# Patient Record
Sex: Female | Born: 1935 | Race: White | Hispanic: No
Health system: Southern US, Community
[De-identification: ages and names within clinical notes are randomized; demographics above are authoritative.]

## PROBLEM LIST (undated history)

## (undated) DIAGNOSIS — R1310 Dysphagia, unspecified: Secondary | ICD-10-CM

## (undated) DIAGNOSIS — K219 Gastro-esophageal reflux disease without esophagitis: Secondary | ICD-10-CM

## (undated) DIAGNOSIS — F0390 Unspecified dementia without behavioral disturbance: Secondary | ICD-10-CM

## (undated) DIAGNOSIS — I10 Essential (primary) hypertension: Secondary | ICD-10-CM

## (undated) DIAGNOSIS — E119 Type 2 diabetes mellitus without complications: Secondary | ICD-10-CM

## (undated) DIAGNOSIS — F32A Depression, unspecified: Secondary | ICD-10-CM

## (undated) DIAGNOSIS — F329 Major depressive disorder, single episode, unspecified: Secondary | ICD-10-CM

## (undated) DIAGNOSIS — E079 Disorder of thyroid, unspecified: Secondary | ICD-10-CM

## (undated) DIAGNOSIS — F419 Anxiety disorder, unspecified: Secondary | ICD-10-CM

## (undated) DIAGNOSIS — M1990 Unspecified osteoarthritis, unspecified site: Secondary | ICD-10-CM

## (undated) DIAGNOSIS — H409 Unspecified glaucoma: Secondary | ICD-10-CM

## (undated) DIAGNOSIS — Z66 Do not resuscitate: Secondary | ICD-10-CM

## (undated) DIAGNOSIS — G8929 Other chronic pain: Secondary | ICD-10-CM

## (undated) DIAGNOSIS — E876 Hypokalemia: Secondary | ICD-10-CM

---

## 2005-03-23 ENCOUNTER — Ambulatory Visit: Payer: Self-pay | Admitting: Occupational Therapy

## 2016-05-15 ENCOUNTER — Emergency Department (HOSPITAL_COMMUNITY)
Admission: EM | Admit: 2016-05-15 | Discharge: 2016-05-15 | Disposition: A | Discharge status: Skilled Nursing Facility | Payer: Medicare Other | Attending: Emergency Medicine | Admitting: Emergency Medicine

## 2016-05-15 ENCOUNTER — Emergency Department (HOSPITAL_COMMUNITY): Payer: Medicare Other

## 2016-05-15 ENCOUNTER — Encounter (HOSPITAL_COMMUNITY): Payer: Self-pay

## 2016-05-15 DIAGNOSIS — E119 Type 2 diabetes mellitus without complications: Secondary | ICD-10-CM | POA: Insufficient documentation

## 2016-05-15 DIAGNOSIS — I10 Essential (primary) hypertension: Secondary | ICD-10-CM | POA: Diagnosis not present

## 2016-05-15 DIAGNOSIS — W19XXXA Unspecified fall, initial encounter: Secondary | ICD-10-CM

## 2016-05-15 DIAGNOSIS — M25552 Pain in left hip: Secondary | ICD-10-CM | POA: Insufficient documentation

## 2016-05-15 DIAGNOSIS — M1990 Unspecified osteoarthritis, unspecified site: Secondary | ICD-10-CM | POA: Insufficient documentation

## 2016-05-15 DIAGNOSIS — Z79899 Other long term (current) drug therapy: Secondary | ICD-10-CM | POA: Diagnosis not present

## 2016-05-15 DIAGNOSIS — F329 Major depressive disorder, single episode, unspecified: Secondary | ICD-10-CM | POA: Insufficient documentation

## 2016-05-15 DIAGNOSIS — F0390 Unspecified dementia without behavioral disturbance: Secondary | ICD-10-CM | POA: Insufficient documentation

## 2016-05-15 HISTORY — DX: Unspecified glaucoma: H40.9

## 2016-05-15 HISTORY — DX: Type 2 diabetes mellitus without complications: E11.9

## 2016-05-15 HISTORY — DX: Dysphagia, unspecified: R13.10

## 2016-05-15 HISTORY — DX: Unspecified osteoarthritis, unspecified site: M19.90

## 2016-05-15 HISTORY — DX: Anxiety disorder, unspecified: F41.9

## 2016-05-15 HISTORY — DX: Hypokalemia: E87.6

## 2016-05-15 HISTORY — DX: Depression, unspecified: F32.A

## 2016-05-15 HISTORY — DX: Gastro-esophageal reflux disease without esophagitis: K21.9

## 2016-05-15 HISTORY — DX: Major depressive disorder, single episode, unspecified: F32.9

## 2016-05-15 HISTORY — DX: Other chronic pain: G89.29

## 2016-05-15 HISTORY — DX: Disorder of thyroid, unspecified: E07.9

## 2016-05-15 HISTORY — DX: Essential (primary) hypertension: I10

## 2016-05-15 HISTORY — DX: Do not resuscitate: Z66

## 2016-05-15 HISTORY — DX: Unspecified dementia, unspecified severity, without behavioral disturbance, psychotic disturbance, mood disturbance, and anxiety: F03.90

## 2016-05-15 LAB — COMPREHENSIVE METABOLIC PANEL
ALT: 25 U/L (ref 14–54)
ANION GAP: 5 (ref 5–15)
AST: 22 U/L (ref 15–41)
Albumin: 3.8 g/dL (ref 3.5–5.0)
Alkaline Phosphatase: 73 U/L (ref 38–126)
BUN: 32 mg/dL — AB (ref 6–20)
CHLORIDE: 103 mmol/L (ref 101–111)
CO2: 27 mmol/L (ref 22–32)
Calcium: 8.9 mg/dL (ref 8.9–10.3)
Creatinine, Ser: 1.45 mg/dL — ABNORMAL HIGH (ref 0.44–1.00)
GFR, EST AFRICAN AMERICAN: 35 mL/min — AB (ref >60)
GFR, EST NON AFRICAN AMERICAN: 30 mL/min — AB (ref >60)
Glucose, Bld: 100 mg/dL — ABNORMAL HIGH (ref 65–99)
POTASSIUM: 5 mmol/L (ref 3.5–5.1)
Sodium: 135 mmol/L (ref 135–145)
Total Bilirubin: 0.5 mg/dL (ref 0.3–1.2)
Total Protein: 7.4 g/dL (ref 6.5–8.1)

## 2016-05-15 LAB — CBC WITH DIFFERENTIAL/PLATELET
BASOS ABS: 0 10*3/uL (ref 0.0–0.1)
BASOS PCT: 0 %
EOS ABS: 0.2 10*3/uL (ref 0.0–0.7)
EOS PCT: 3 %
HCT: 39.3 % (ref 36.0–46.0)
Hemoglobin: 12.4 g/dL (ref 12.0–15.0)
Lymphocytes Relative: 21 %
Lymphs Abs: 1.6 10*3/uL (ref 0.7–4.0)
MCH: 30.1 pg (ref 26.0–34.0)
MCHC: 31.6 g/dL (ref 30.0–36.0)
MCV: 95.4 fL (ref 78.0–100.0)
Monocytes Absolute: 0.5 10*3/uL (ref 0.1–1.0)
Monocytes Relative: 7 %
NEUTROS PCT: 69 %
Neutro Abs: 5.3 10*3/uL (ref 1.7–7.7)
PLATELETS: 245 10*3/uL (ref 150–400)
RBC: 4.12 MIL/uL (ref 3.87–5.11)
RDW: 13.8 % (ref 11.5–15.5)
WBC: 7.7 10*3/uL (ref 4.0–10.5)

## 2016-05-15 LAB — URINE MICROSCOPIC-ADD ON

## 2016-05-15 LAB — URINALYSIS, ROUTINE W REFLEX MICROSCOPIC
BILIRUBIN URINE: NEGATIVE
Glucose, UA: NEGATIVE mg/dL
KETONES UR: NEGATIVE mg/dL
LEUKOCYTES UA: NEGATIVE
Nitrite: NEGATIVE
PROTEIN: NEGATIVE mg/dL
Specific Gravity, Urine: 1.02 (ref 1.005–1.030)
pH: 6 (ref 5.0–8.0)

## 2016-05-15 LAB — LACTIC ACID, PLASMA
LACTIC ACID, VENOUS: 1.1 mmol/L (ref 0.5–2.0)
LACTIC ACID, VENOUS: 0.8 mmol/L (ref 0.5–2.0)

## 2016-05-15 LAB — TROPONIN I: TROPONIN I: 0.03 ng/mL (ref <0.031)

## 2016-05-15 MED ORDER — SODIUM CHLORIDE 0.9 % IV SOLN
INTRAVENOUS | Status: DC
  Administered 2016-05-15: 16:00:00 via INTRAVENOUS

## 2016-05-15 MED ORDER — SODIUM CHLORIDE 0.9 % IV BOLUS (SEPSIS)
250.0000 mL | Freq: Once | INTRAVENOUS | Status: AC
  Administered 2016-05-15: 250 mL via INTRAVENOUS

## 2016-05-15 NOTE — ED Notes (Signed)
[facility]. States they have no drivers today to transport patient. Will send pt via EMS back to facility.

## 2016-05-15 NOTE — ED Notes (Signed)
Pt given dinner meal tray

## 2016-05-15 NOTE — ED Notes (Signed)
Unable to get in touch with a staff member at [facility] with multiple attempts. Charge RN aware.

## 2016-05-15 NOTE — ED Notes (Signed)
Pt is currently on Cefdinir for UTI

## 2016-05-15 NOTE — ED Notes (Signed)
Pt found to be getting out of bed. Pt had pulled out her IV. Bleeding controlled. Pt assisted to wheelchair. Charge RN Darel HongJudy attempting to get in contact with [facility] back [facility].

## 2016-05-15 NOTE — ED Notes (Signed)
Pt here from [facility] for re evaluation after a fall six days ago. Per EMS, they were told pt may have a UTI and has increased confusion.

## 2016-05-15 NOTE — ED Notes (Signed)
Pt pulled IV out and was attempting to slide out the bottom of the bed. Pt assisted to wheelchair and parked at nurses station. Attempted to call [facility] twice but no one answered

## 2016-05-15 NOTE — ED Provider Notes (Signed)
CSN: [id number]     Arrival date & time 05/15/16  1118 History   First MD Initiated Contact with Patient 05/15/16 1210     No chief complaint on file.     The history is provided by the patient, the nursing home and the EMS personnel. The history is limited by the condition of the patient (hx dementia).  Pt was seen at 1210. Per EMS, NH report and pt: NH states pt has been "more confused that usual" over the past several weeks, especially since falling 6 days ago. Pt is currently on abx for UTI. Pt herself only c/o left hip "pain," unknown onset. Pt otherwise does not have any complaints. Pt has significant hx of dementia.    Past Medical History  Diagnosis Date  . Diabetes mellitus without complication (HCC)   . Dysphagia   . Thyroid disease   . Dementia   . Acid reflux   . Anxiety   . Depression   . Hypokalemia   . Glaucoma   . Hypertension   . Arthritis   . Chronic pain   . DNR (do not resuscitate)    History reviewed. No pertinent past surgical history.  Social History  Substance Use Topics  . Smoking status: Unknown If Ever Smoked  . Smokeless tobacco: None  . Alcohol Use: None    Review of Systems  Unable to perform ROS: Dementia      Allergies  Ciprofloxacin; Penicillins; and Sulfa antibiotics  Home Medications   Prior to Admission medications   Not on File   BP 114/68 mmHg  Pulse 77  Temp(Src) 97.5 F (36.4 C) (Oral)  Resp 16  SpO2 98% Physical Exam  1215: Physical examination:  Nursing notes reviewed; Vital signs and O2 SAT reviewed;  Constitutional: Well developed, Well nourished, Well hydrated, In no acute distress; Head:  Normocephalic, +fading ecchymosis right forehead and face.; Eyes: EOMI, PERRL, No scleral icterus; ENMT: Mouth and pharynx normal, Mucous membranes moist; Neck: Supple, Full range of motion, No lymphadenopathy; Cardiovascular: Regular rate and rhythm, No gallop; Respiratory: Breath sounds clear & equal bilaterally, No wheezes.   Speaking full sentences with ease, Normal respiratory effort/excursion; Chest: Nontender, Movement normal; Abdomen: Soft, Nontender, Nondistended, Normal bowel sounds; Genitourinary: No CVA tenderness; Spine:  No midline CS, TS, LS tenderness.;; Extremities: Pulses normal, Pelvis stable. +mild TTP left hip, NT left knee/ankle/foot. Bilat UE's and RLE without tenderness, No deformity, no edema, No calf edema or asymmetry.; Neuro: Awake, alert, confused per hx dementia. No facial droop. Speech clear. Grips equal. Strength 4/5 equal bilat UE's and LE's. Moves all extremities spontaneously and to command without apparent gross focal motor deficits.; Skin: Color normal, Warm, Dry.   ED Course  Procedures (including critical care time) Labs Review   Imaging Review   I have personally reviewed and evaluated these images and lab results as part of my medical decision-making.   EKG Interpretation   Date/Time:  Monday May 15 2016 12:40:06 EDT Ventricular Rate:  72 PR Interval:  146 QRS Duration: 97 QT Interval:  444 QTC Calculation: 486 R Axis:   59 Text Interpretation:  Sinus rhythm Borderline prolonged QT interval  Baseline wander No old tracing to compare Confirmed by [facility]  MD,  Nicholos Johns [phone]) on 05/15/2016 1:01:09 PM      MDM  MDM Reviewed: previous chart, nursing note and vitals Reviewed previous: labs and ECG Interpretation: labs, ECG, x-ray and CT scan     Results for orders placed or performed  during the hospital encounter of 05/15/16  Urinalysis, Routine w reflex microscopic  Result Value Ref Range   Color, Urine YELLOW YELLOW   APPearance CLEAR CLEAR   Specific Gravity, Urine 1.020 1.005 - 1.030   pH 6.0 5.0 - 8.0   Glucose, UA NEGATIVE NEGATIVE mg/dL   Hgb urine dipstick TRACE (A) NEGATIVE   Bilirubin Urine NEGATIVE NEGATIVE   Ketones, ur NEGATIVE NEGATIVE mg/dL   Protein, ur NEGATIVE NEGATIVE mg/dL   Nitrite NEGATIVE NEGATIVE   Leukocytes, UA NEGATIVE NEGATIVE   Comprehensive metabolic panel  Result Value Ref Range   Sodium 135 135 - 145 mmol/L   Potassium 5.0 3.5 - 5.1 mmol/L   Chloride 103 101 - 111 mmol/L   CO2 27 22 - 32 mmol/L   Glucose, Bld 100 (H) 65 - 99 mg/dL   BUN 32 (H) 6 - 20 mg/dL   Creatinine, Ser 4.54 (H) 0.44 - 1.00 mg/dL   Calcium 8.9 8.9 - 09.8 mg/dL   Total Protein 7.4 6.5 - 8.1 g/dL   Albumin 3.8 3.5 - 5.0 g/dL   AST 22 15 - 41 U/L   ALT 25 14 - 54 U/L   Alkaline Phosphatase 73 38 - 126 U/L   Total Bilirubin 0.5 0.3 - 1.2 mg/dL   GFR calc non Af Amer 30 (L) >60 mL/min   GFR calc Af Amer 35 (L) >60 mL/min   Anion gap 5 5 - 15  Troponin I  Result Value Ref Range   Troponin I 0.03 <0.031 ng/mL  Lactic acid, plasma  Result Value Ref Range   Lactic Acid, Venous 1.1 0.5 - 2.0 mmol/L  Lactic acid, plasma  Result Value Ref Range   Lactic Acid, Venous 0.8 0.5 - 2.0 mmol/L  CBC with Differential  Result Value Ref Range   WBC 7.7 4.0 - 10.5 K/uL   RBC 4.12 3.87 - 5.11 MIL/uL   Hemoglobin 12.4 12.0 - 15.0 g/dL   HCT 11.9 14.7 - 82.9 %   MCV 95.4 78.0 - 100.0 fL   MCH 30.1 26.0 - 34.0 pg   MCHC 31.6 30.0 - 36.0 g/dL   RDW 56.2 13.0 - 86.5 %   Platelets 245 150 - 400 K/uL   Neutrophils Relative % 69 %   Neutro Abs 5.3 1.7 - 7.7 K/uL   Lymphocytes Relative 21 %   Lymphs Abs 1.6 0.7 - 4.0 K/uL   Monocytes Relative 7 %   Monocytes Absolute 0.5 0.1 - 1.0 K/uL   Eosinophils Relative 3 %   Eosinophils Absolute 0.2 0.0 - 0.7 K/uL   Basophils Relative 0 %   Basophils Absolute 0.0 0.0 - 0.1 K/uL  Urine microscopic-add on  Result Value Ref Range   Squamous Epithelial / LPF 0-5 (A) NONE SEEN   WBC, UA 0-5 0 - 5 WBC/hpf   RBC / HPF 0-5 0 - 5 RBC/hpf   Bacteria, UA RARE (A) NONE SEEN   Dg Chest 1 View 05/15/2016  CLINICAL DATA:  Status post fall, left hip pain EXAM: CHEST 1 VIEW COMPARISON:  None. FINDINGS: Mild bilateral interstitial prominence. There is no focal parenchymal opacity. There is no pleural effusion or  pneumothorax. The heart and mediastinal contours are unremarkable. The osseous structures are unremarkable. IMPRESSION: No active disease. Electronically Signed   By: Elige Ko   On: 05/15/2016 13:41   Ct Head Wo Contrast 05/15/2016  CLINICAL DATA:  Worsening confusion.  Dementia. EXAM: CT HEAD WITHOUT CONTRAST TECHNIQUE: Contiguous axial  images were obtained from the base of the skull through the vertex without intravenous contrast. COMPARISON:  None. FINDINGS: Sinuses/Soft tissues: Surgical changes about both globes. No significant soft tissue swelling. Posterior ethmoid air cell and sphenoid sinus opacification on the right. Minimal right frontal sinus mucosal thickening. Clear mastoid air cells. Intracranial: Mild for age low density in the periventricular white matter likely related to small vessel disease. Expected cerebral volume loss for age. Bilateral vertebral artery atherosclerosis. IMPRESSION: 1. Normal intracranial head CT for age. 2. Sinus disease. Electronically Signed   By: Jeronimo GreavesKyle  Talbot M.D.   On: 05/15/2016 13:31   Dg Hip Unilat With Pelvis 2-3 Views Left 05/15/2016  CLINICAL DATA:  Left hip pain EXAM: DG HIP (WITH OR WITHOUT PELVIS) 2-3V LEFT COMPARISON:  None. FINDINGS: There is no acute fracture or dislocation. There is severe osteoarthritis of the left hip. There is an old right intertrochanteric fracture transfixed with a intra medullary nail and interlocking screw. There are degenerative changes of bilateral sacroiliac joints. There are degenerative changes of the lower lumbar spine. IMPRESSION: 1.  No acute osseous injury of the left hip. 2. Severe osteoarthritis of the left hip. Electronically Signed   By: Elige KoHetal  Patel   On: 05/15/2016 13:45    1645:  Pt not ambulatory per baseline. Has tol PO well while in the ED without N/V. Workup today reassuring. Pt pleasantly confused while in the ED, NAD, resps easy, abd soft/NT, neuro non-focal. Will d/c back to NH stable.   Samuel JesterKathleen  Janet Decesare, DO 05/17/16 2110

## 2016-05-15 NOTE — Discharge Instructions (Signed)
Take your usual prescriptions as previously directed.  Call your regular medical doctor tomorrow to schedule a follow up appointment within the next 2 days.  Return to the Emergency Department immediately sooner if worsening.  ° °

## 2016-05-18 LAB — URINE CULTURE: Culture: 40000 — AB

## 2016-05-19 ENCOUNTER — Telehealth (HOSPITAL_BASED_OUTPATIENT_CLINIC_OR_DEPARTMENT_OTHER): Payer: Self-pay

## 2016-05-19 NOTE — Telephone Encounter (Signed)
Pt with negative urine culture, no treatment necessary per Pharm D Cassie Roseanne RenoStewart

## 2016-05-19 NOTE — Progress Notes (Signed)
ED Antimicrobial Stewardship Positive Culture Follow Up   Alyson ReedyFrances M Franco is an 82 y.o. female who [facility] with a chief complaint of No chief complaint on file.   Recent Results (from the past 720 hour(s))  Urine culture     Status: Abnormal   Collection Time: 05/15/16 12:52 PM  Result Value Ref Range Status   Specimen Description URINE, CLEAN CATCH  Final   Special Requests NONE  Final   Culture 40,000 COLONIES/mL ENTEROCOCCUS SPECIES (A)  Final   Report Status 05/18/2016 FINAL  Final   Organism ID, Bacteria ENTEROCOCCUS SPECIES (A)  Final      Susceptibility   Enterococcus species - MIC*    AMPICILLIN >=32 RESISTANT Resistant     LEVOFLOXACIN >=8 RESISTANT Resistant     NITROFURANTOIN 256 RESISTANT Resistant     VANCOMYCIN <=0.5 SENSITIVE Sensitive     * 40,000 COLONIES/mL ENTEROCOCCUS SPECIES     UA completely negative and no fever. No further treatment indicated. Only 40,000 colonies - likely contaminant.  ED Provider: Rhea BleacherJosh Geiple, PA-C  Cassie L. Roseanne RenoStewart, PharmD PGY2 Infectious Diseases Pharmacy Resident Pager: [phone] 05/19/2016 9:38 AM

## 2016-11-20 IMAGING — CR DG CHEST 1V
1 series · 1 of 1 positions shown · non-contrast
Comparison: None.

CLINICAL DATA: Status post fall, left hip pain

EXAM:
CHEST 1 VIEW

[pa]
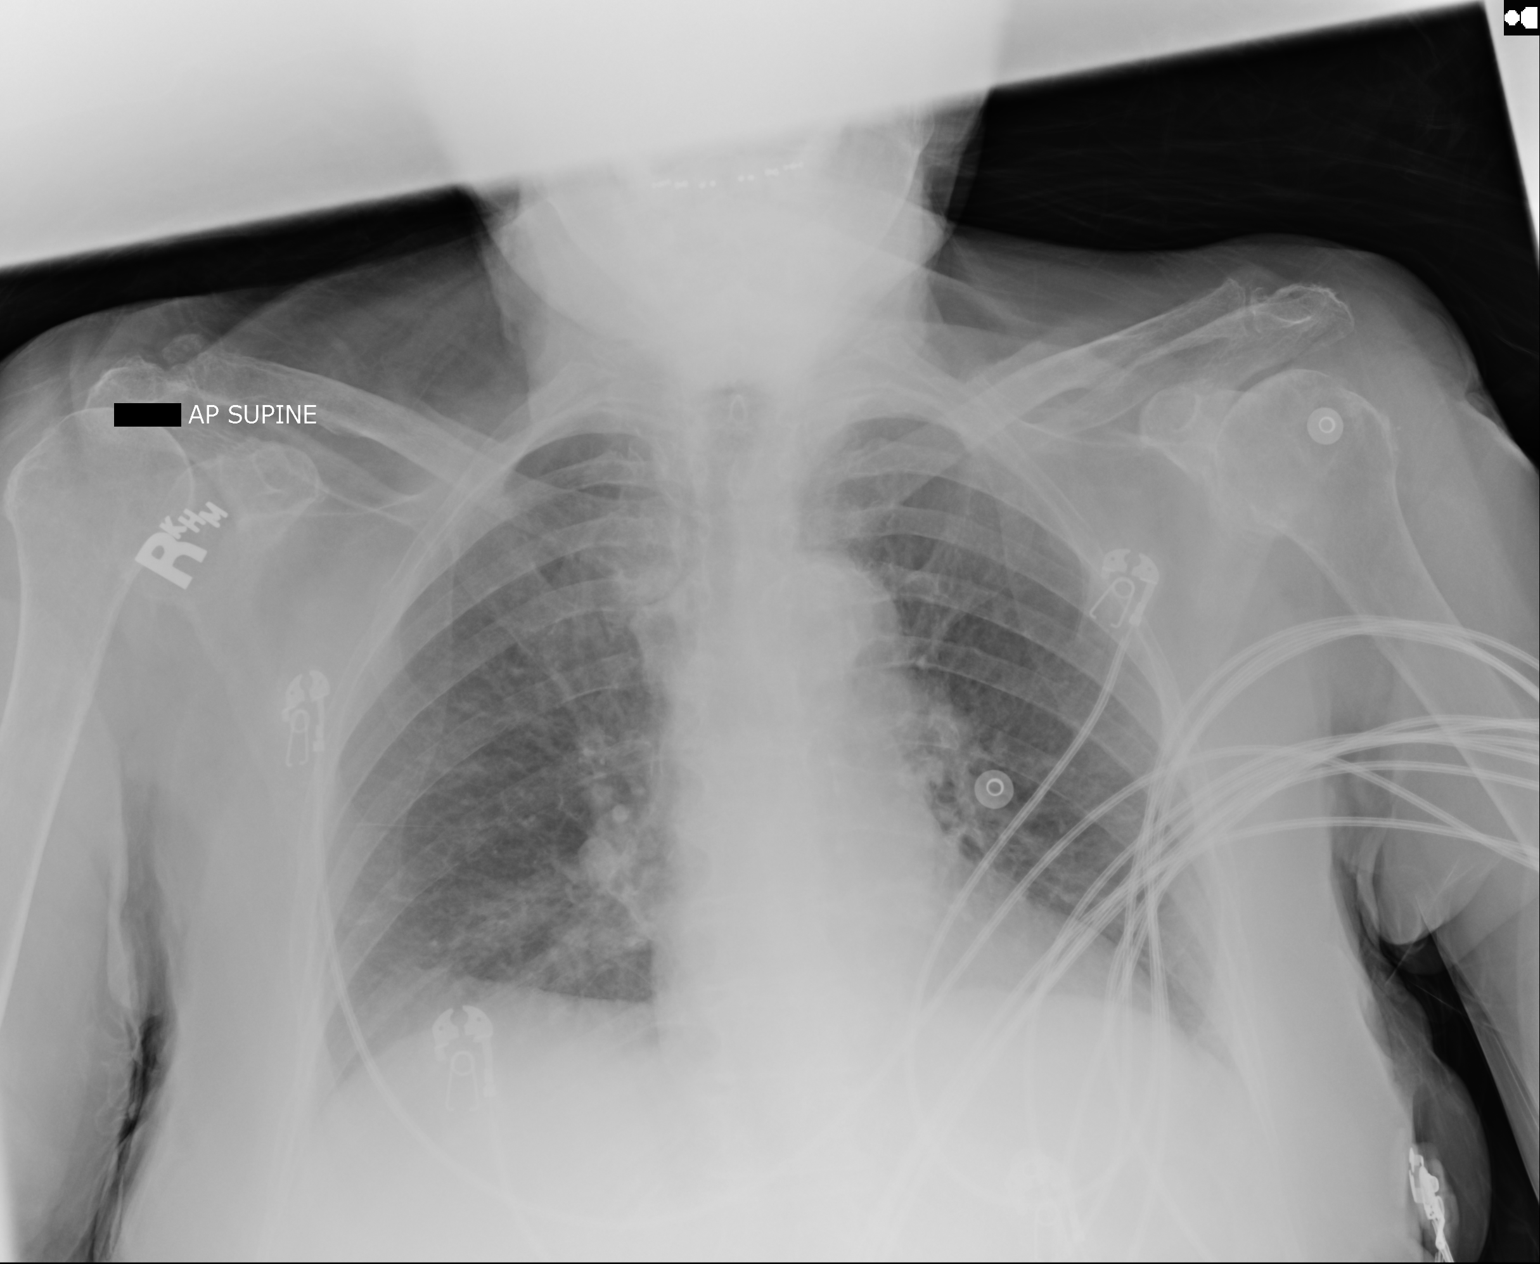

[1 of 1 positions shown; findings below may reference images not displayed]

FINDINGS: Mild bilateral interstitial prominence. There is no focal
parenchymal opacity. There is no pleural effusion or pneumothorax.
The heart and mediastinal contours are unremarkable.

The osseous structures are unremarkable.
IMPRESSION: No active disease.

## 2018-11-17 DEATH — deceased
